# Patient Record
Sex: Female | Born: 1990 | Race: White | Hispanic: No | Marital: Single | Smoking: Never smoker
Health system: Southern US, Community
[De-identification: ages and names within clinical notes are randomized; demographics above are authoritative.]

## PROBLEM LIST (undated history)

## (undated) DIAGNOSIS — G43909 Migraine, unspecified, not intractable, without status migrainosus: Secondary | ICD-10-CM

---

## 2013-10-25 ENCOUNTER — Emergency Department (HOSPITAL_COMMUNITY)
Admission: EM | Admit: 2013-10-25 | Discharge: 2013-10-25 | Disposition: A | Discharge status: Home / Self Care | Payer: No Typology Code available for payment source | Attending: Emergency Medicine | Admitting: Emergency Medicine

## 2013-10-25 ENCOUNTER — Encounter (HOSPITAL_COMMUNITY): Payer: Self-pay | Admitting: Emergency Medicine

## 2013-10-25 ENCOUNTER — Emergency Department (HOSPITAL_COMMUNITY): Payer: No Typology Code available for payment source

## 2013-10-25 DIAGNOSIS — Y9389 Activity, other specified: Secondary | ICD-10-CM | POA: Diagnosis not present

## 2013-10-25 DIAGNOSIS — Z8679 Personal history of other diseases of the circulatory system: Secondary | ICD-10-CM | POA: Diagnosis not present

## 2013-10-25 DIAGNOSIS — W1809XA Striking against other object with subsequent fall, initial encounter: Secondary | ICD-10-CM | POA: Insufficient documentation

## 2013-10-25 DIAGNOSIS — Y929 Unspecified place or not applicable: Secondary | ICD-10-CM | POA: Insufficient documentation

## 2013-10-25 DIAGNOSIS — Z79899 Other long term (current) drug therapy: Secondary | ICD-10-CM | POA: Diagnosis not present

## 2013-10-25 DIAGNOSIS — S0990XA Unspecified injury of head, initial encounter: Secondary | ICD-10-CM | POA: Diagnosis not present

## 2013-10-25 DIAGNOSIS — W19XXXA Unspecified fall, initial encounter: Secondary | ICD-10-CM

## 2013-10-25 HISTORY — DX: Migraine, unspecified, not intractable, without status migrainosus: G43.909

## 2013-10-25 NOTE — Discharge Instructions (Signed)
Return to the ED with any concerns including vomiting and not able to keep down liquids, seizure activity, changes in vision or speech, decreased level of alertness/lethargy, or any other alarming symptoms

## 2013-10-25 NOTE — ED Provider Notes (Signed)
CSN: [id number]     Arrival date & time 10/25/13  1917 History   First MD Initiated Contact with Patient 10/25/13 2037     Chief Complaint  Patient presents with  . Fall     (Consider location/radiation/quality/duration/timing/severity/associated sxs/prior Treatment) HPI Pt presents with c/o fall and hitting her head.  She states that she had been drinking alcohol last night and remembers hitting her head, but not the fall.  Friend was with her and states she did not lose consciousness, but did hit the back of her head twice and seemed dazed afterward.  No vomiting.  No LOC.  No neck or back  Pain.  She slept for several hours and awoke normally.  Throughout the day today she has been having headache.  Took ibuprofen which did not help very much.  No confusion, no changes in vision or speech.  There are no other associated systemic symptoms, there are no other alleviating or modifying factors.   Past Medical History  Diagnosis Date  . Migraines    History reviewed. No pertinent past surgical history. History reviewed. No pertinent family history. History  Substance Use Topics  . Smoking status: Never Smoker   . Smokeless tobacco: Not on file  . Alcohol Use: Yes   OB History   Grav Para Term Preterm Abortions TAB SAB Ect Mult Living                 Review of Systems ROS reviewed and all otherwise negative except for mentioned in HPI    Allergies  Review of patient's allergies indicates no known allergies.  Home Medications   Prior to Admission medications   Medication Sig Start Date End Date Taking? Authorizing Provider  Multiple Vitamin (MULTIVITAMIN WITH MINERALS) TABS tablet Take 1 tablet by mouth daily.   Yes Historical Provider, MD  NORETHINDRONE ACET-ETHINYL EST PO Take by mouth.   Yes Historical Provider, MD  valACYclovir (VALTREX) 500 MG tablet Take 500 mg by mouth daily.   Yes Historical Provider, MD   BP 122/69  Pulse 84  Temp(Src) 97.9 F (36.6 C) (Oral)   Resp 18  SpO2 99%  LMP 10/14/2013 Vitals reviewed Physical Exam Physical Examination: General appearance - alert, well appearing, and in no distress Mental status - alert, oriented to person, place, and time Head- NCAT except tiny superficial abrasion to occiput Eyes - pupils equal and reactive, extraocular eye movements intact Mouth - mucous membranes moist, pharynx normal without lesions Neck - no midline tenderness to palpation, FROM without pain Chest - clear to auscultation, no wheezes, rales or rhonchi, symmetric air entry Heart - normal rate, regular rhythm, normal S1, S2, no murmurs, rubs, clicks or gallops Back exam - full range of motion, no midline tenderness Neurological - alert, oriented, normal speech, no focal findings or movement disorder noted Musculoskeletal - no joint tenderness, deformity or swelling Extremities - peripheral pulses normal, no pedal edema, no clubbing or cyanosis Skin - normal coloration and turgor, no rashes  ED Course  Procedures (including critical care time) Labs Review Labs Reviewed - No data to display  Imaging Review No results found.   EKG Interpretation None      MDM   Final diagnoses:  Fall, initial encounter  Minor head injury, initial encounter   Pt presenting with c/o fall and striking her head last night while intoxicated.  Headache persists today.  Head CT reassuring.  No neck or back tenderness.  Discharged with strict return precautions.  Pt  agreeable with plan.  Nursing notes including past medical history and social history reviewed and considered in documentation Prior records reviewed and considered during this visit     Ethelda ChickMartha K Linker, MD 10/28/13 [phone]

## 2013-10-25 NOTE — ED Notes (Signed)
Pt arrived to the Ed with a complaint of a fall and hit her head.  The pt's friend describes two falls with head hits. Pt states the back of her head hurts and she is sore.  Pt admits to excessive alcohol use last night.  Pupils are PERRLA.  Pt is able to answer questions appropriately

## 2014-01-19 ENCOUNTER — Ambulatory Visit (INDEPENDENT_AMBULATORY_CARE_PROVIDER_SITE_OTHER): Payer: PRIVATE HEALTH INSURANCE | Admitting: Emergency Medicine

## 2014-01-19 VITALS — BP 124/74 | HR 80 | Temp 97.8°F | Resp 16 | Ht 64.75 in | Wt 131.4 lb

## 2014-01-19 DIAGNOSIS — Z3049 Encounter for surveillance of other contraceptives: Secondary | ICD-10-CM

## 2014-01-19 DIAGNOSIS — Z124 Encounter for screening for malignant neoplasm of cervix: Secondary | ICD-10-CM

## 2014-01-19 LAB — POCT WET PREP WITH KOH
Clue Cells Wet Prep HPF POC: NEGATIVE
KOH Prep POC: NEGATIVE
Trichomonas, UA: NEGATIVE
Yeast Wet Prep HPF POC: NEGATIVE

## 2014-01-19 MED ORDER — NORETHIN ACE-ETH ESTRAD-FE 1.5-30 MG-MCG PO TABS: 1.0000 | ORAL_TABLET | Freq: Every day | ORAL | Status: AC

## 2014-01-19 MED ORDER — VALACYCLOVIR HCL 500 MG PO TABS: 500.0000 mg | ORAL_TABLET | Freq: Every day | ORAL | Status: AC

## 2014-01-19 NOTE — Patient Instructions (Signed)
Pap Test A Pap test is a procedure done in a clinic office to evaluate cells that are on the surface of the cervix. The cervix is the lower portion of the uterus and upper portion of the vagina. For some women, the cervical region has the potential to form cancer. With consistent evaluations by your caregiver, this type of cancer can be prevented.  If a Pap test is abnormal, it is most often a result of a previous exposure to human papillomavirus (HPV). HPV is a virus that can infect the cells of the cervix and cause dysplasia. Dysplasia is where the cells no longer look normal. If a woman has been diagnosed with high-grade or severe dysplasia, they are at higher risk of developing cervical cancer. People diagnosed with low-grade dysplasia should still be seen by their caregiver because there is a small chance that low-grade dysplasia could develop into cancer.  LET YOUR CAREGIVER KNOW ABOUT:  Recent sexually transmitted infection (STI) you have had.  Any new sex partners you have had.  History of previous abnormal Pap tests results.  History of previous cervical procedures you have had (colposcopy, biopsy, loop electrosurgical excision procedure [LEEP]).  Concerns you have had regarding unusual vaginal discharge.  History of pelvic pain.  Your use of birth control. BEFORE THE PROCEDURE  Ask your caregiver when to schedule your Pap test. It is best not to be on your period if your caregiver uses a wooden spatula to collect cells or applies cells to a glass slide. Newer techniques are not so sensitive to the timing of a menstrual cycle.  Do not douche or have sexual intercourse for 24 hours before the test.   Do not use vaginal creams or tampons for 24 hours before the test.   Empty your bladder just before the test to lessen any discomfort.  PROCEDURE You will lie on an exam table with your feet in stirrups. A warm metal or plastic instrument (speculum) is placed in your vagina. This  instrument allows your caregiver to see the inside of your vagina and look at your cervix. A small, plastic brush or wooden spatula is then used to collect cervical cells. These cells are placed in a lab specimen container. The cells are looked at under a microscope. A specialist will determine if the cells are normal.  AFTER THE PROCEDURE Make sure to get your test results.If your results come back abnormal, you may need further testing.  Document Released: 06/30/2002 Document Revised: 07/02/2011 Document Reviewed: 04/05/2011 [employer] Patient Information 2015 [employer]. This information is not intended to replace advice given to you by your health care provider. Make sure you discuss any questions you have with your health care provider.  

## 2014-01-19 NOTE — Addendum Note (Signed)
Addended by: Carmelina DaneANDERSON, Siah Kannan S on: 01/19/2014 04:35 PM   Modules accepted: Orders

## 2014-01-19 NOTE — Addendum Note (Signed)
Addended by: Johnnette LitterARDWELL, Ayad Nieman M on: 01/19/2014 04:46 PM   Modules accepted: Orders

## 2014-01-19 NOTE — Progress Notes (Signed)
[facility] [address], [place] [id number] [phone]  Date:  01/19/2014   Name:  Jasmine Meza   DOB:  1990/09/03   MRN:  [id number]  PCP:  No PCP Per Patient    Chief Complaint: Medication Refill and Gynecologic Exam   History of Present Illness:  Jasmine IshikawaMorgan Givans is a 23 y.o. very pleasant female patient who presents with the following:  Requests a pap and refill on OCP  No current health complaints.  Has never had a PAP No improvement with over the counter medications or other home remedies. Denies other complaint or health concern today.   There are no active problems to display for this patient.   Past Medical History  Diagnosis Date  . Migraines     History reviewed. No pertinent past surgical history.  History  Substance Use Topics  . Smoking status: Never Smoker   . Smokeless tobacco: Not on file  . Alcohol Use: No    Family History  Problem Relation Age of Onset  . Stroke Maternal Grandmother   . Stroke Maternal Grandfather   . Heart disease Maternal Grandfather   . Cancer Paternal Grandfather     No Known Allergies  Medication list has been reviewed and updated.  Current Outpatient Prescriptions on File Prior to Visit  Medication Sig Dispense Refill  . Multiple Vitamin (MULTIVITAMIN WITH MINERALS) TABS tablet Take 1 tablet by mouth daily.      . NORETHINDRONE ACET-ETHINYL EST PO Take by mouth.      . valACYclovir (VALTREX) 500 MG tablet Take 500 mg by mouth daily.       No current facility-administered medications on file prior to visit.    Review of Systems:  As per HPI, otherwise negative.    Physical Examination: Filed Vitals:   01/19/14 1607  BP: 124/74  Pulse: 80  Temp: 97.8 F (36.6 C)  Resp: 16   Filed Vitals:   01/19/14 1607  Height: 5' 4.75" (1.645 m)  Weight: 131 lb 6.4 oz (59.603 kg)   Body mass index is 22.03 kg/(m^2). Ideal Body Weight: Weight in (lb) to have BMI = 25: 148.8  GEN: WDWN, NAD,  Non-toxic, A & O x 3 HEENT: Atraumatic, Normocephalic. Neck supple. No masses, No LAD. Ears and Nose: No external deformity. CV: RRR, No M/G/R. No JVD. No thrill. No extra heart sounds. PULM: CTA B, no wheezes, crackles, rhonchi. No retractions. No resp. distress. No accessory muscle use. ABD: S, NT, ND, +BS. No rebound. No HSM. EXTR: No c/c/e NEURO Normal gait.  PSYCH: Normally interactive. Conversant. Not depressed or anxious appearing.  Calm demeanor.  Physical Examination: Pelvic - normal external genitalia, vulva, vagina, cervix, uterus and adnexa, exam chaperoned by CHAN     Assessment and Plan: OCP refill Pap  Signed,  Phillips OdorJeffery Dishawn Bhargava, MD

## 2014-01-20 LAB — PAP IG, CT-NG, RFX HPV ASCU
CHLAMYDIA PROBE AMP: NEGATIVE
GC Probe Amp: NEGATIVE

## 2015-02-11 IMAGING — CT CT HEAD W/O CM
2 series · 17 of 30 positions shown, 20 images · non-contrast
Comparison: None.

CLINICAL DATA: Posterior headache after falling and hitting her
head twice.

EXAM:
CT HEAD WITHOUT CONTRAST
TECHNIQUE: Contiguous axial images were obtained from the base of the skull
through the vertex without intravenous contrast.

[Series 2: head w/o · axial · non-contrast · 0.39mm/px · z∈[-9,+101]mm · 9 of 28 slices shown, 12 images]
[im 3/28  brain]
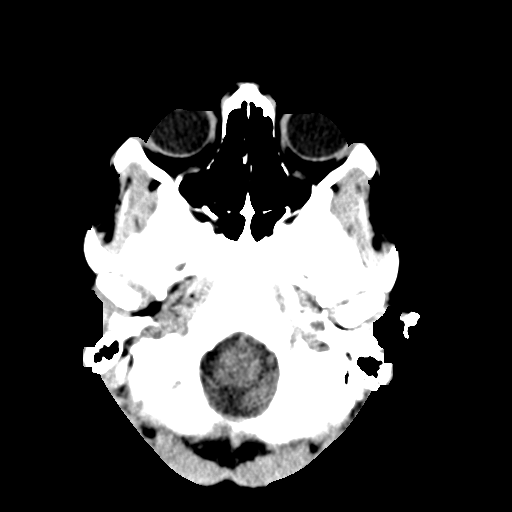
[im 3/28  bone]
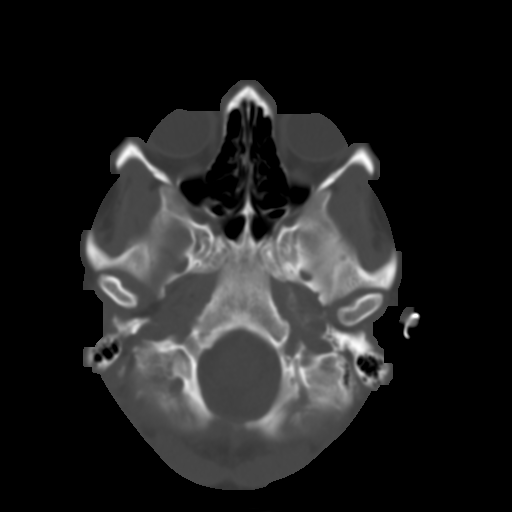
[im 6/28  brain]
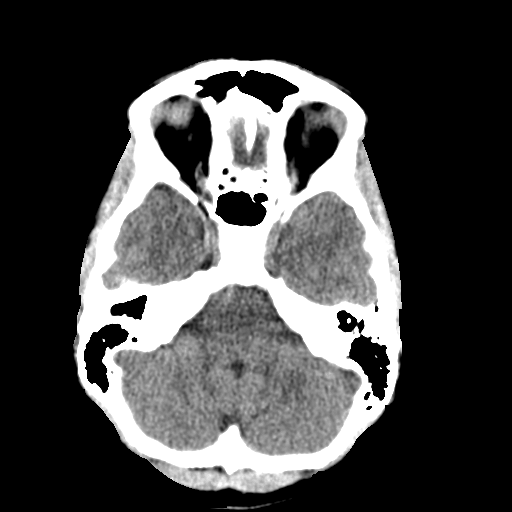
[im 9/28  brain]
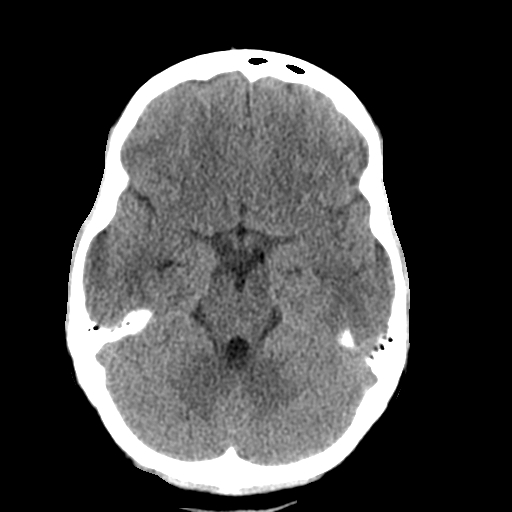
[im 11/28  brain]
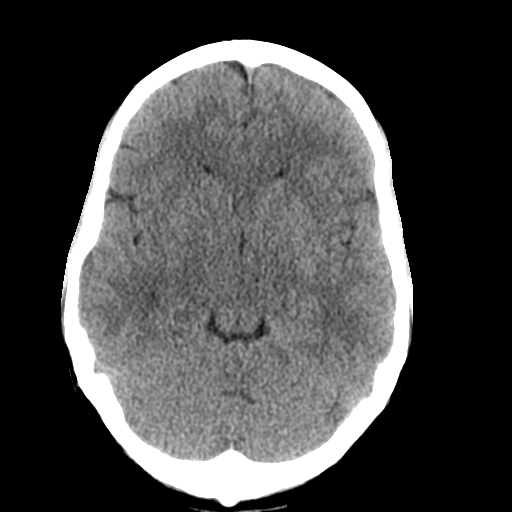
[im 14/28  brain]
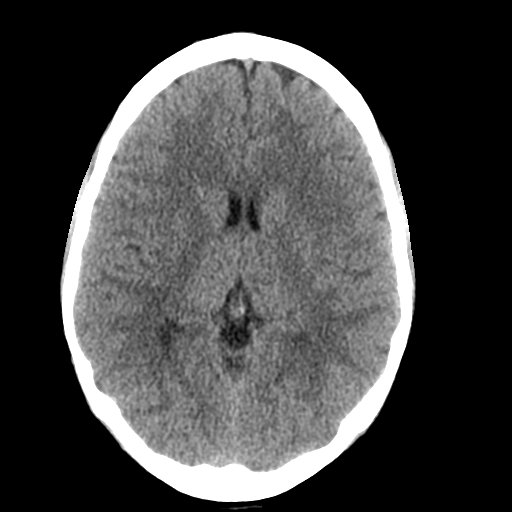
[im 14/28  bone]
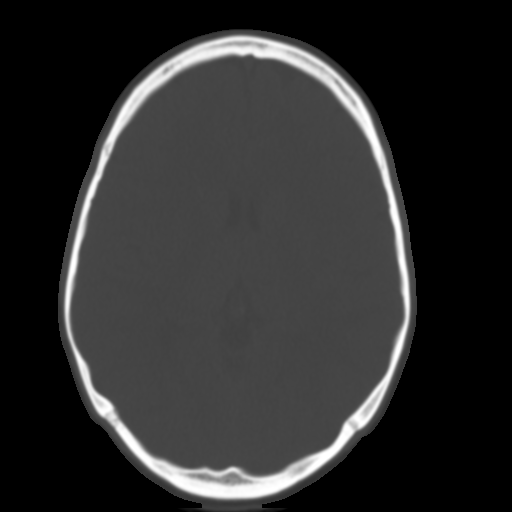
[im 17/28  brain]
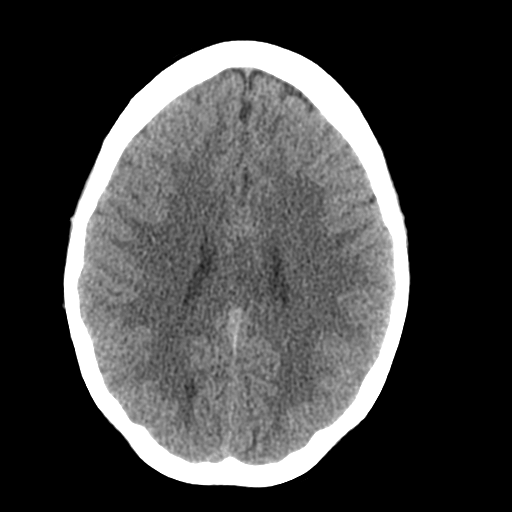
[im 19/28  brain]
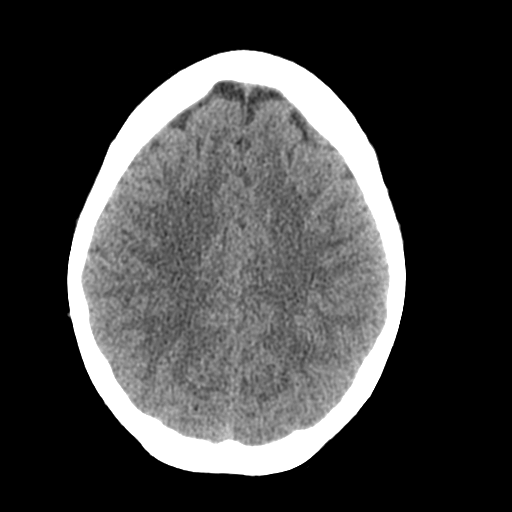
[im 22/28  brain]
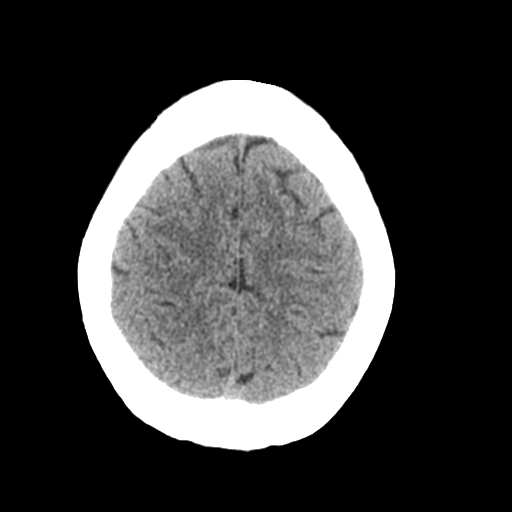
[im 25/28  brain]
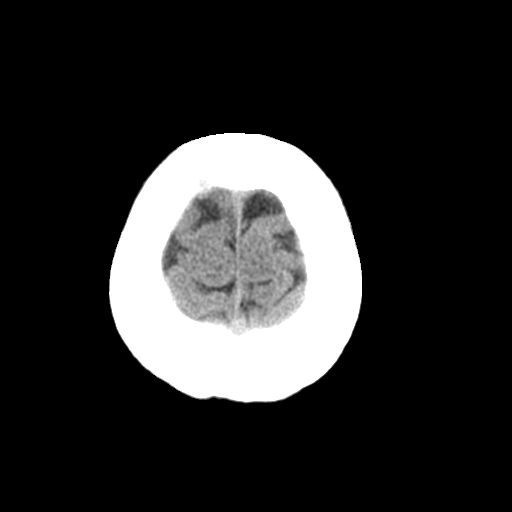
[im 25/28  bone]
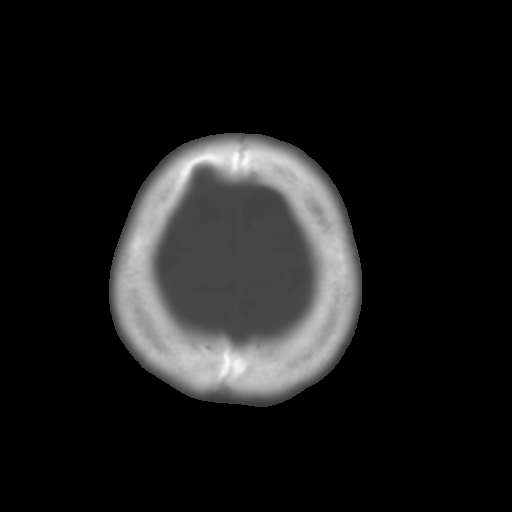

[Series 3: bone windows · axial · 0.39mm/px · z∈[-4,+101]mm · 8 of 46 slices shown]
[im 6/46  bone]
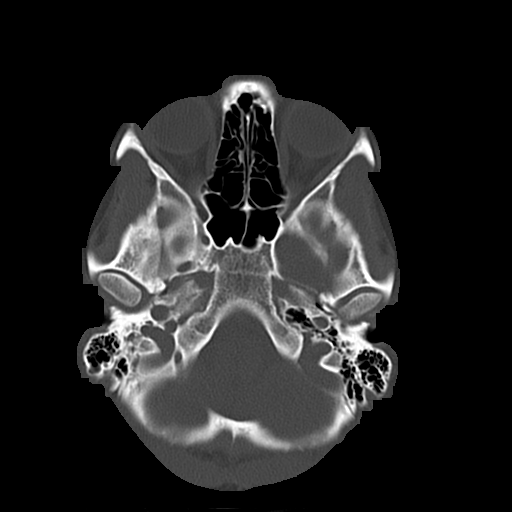
[im 11/46  bone]
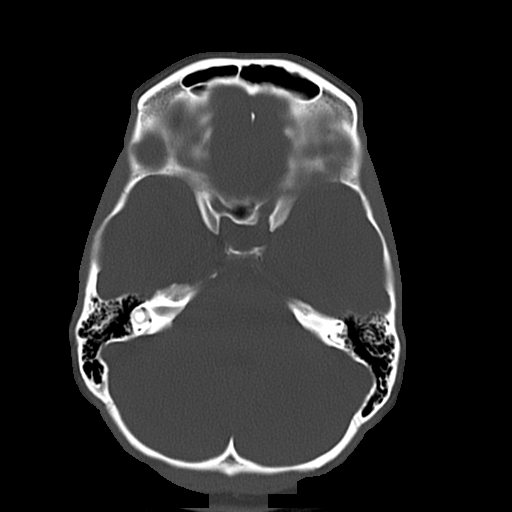
[im 16/46  bone]
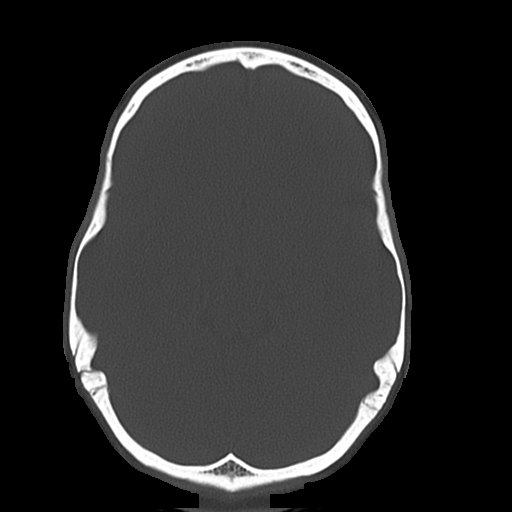
[im 21/46  bone]
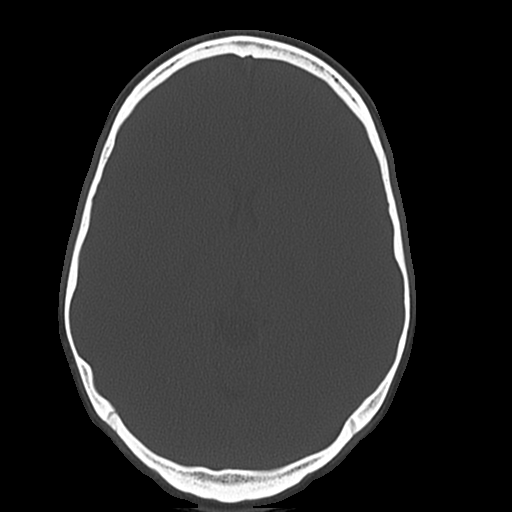
[im 26/46  bone]
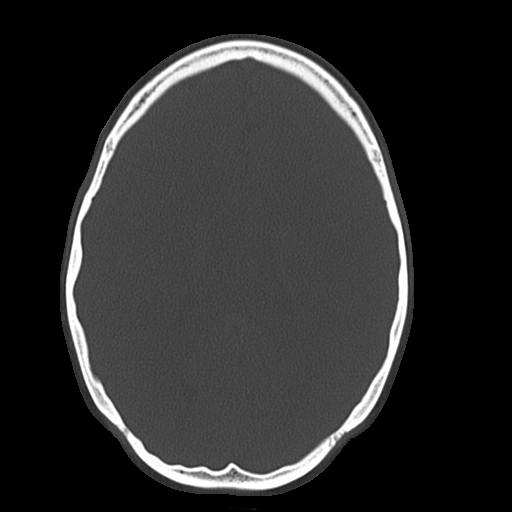
[im 31/46  bone]
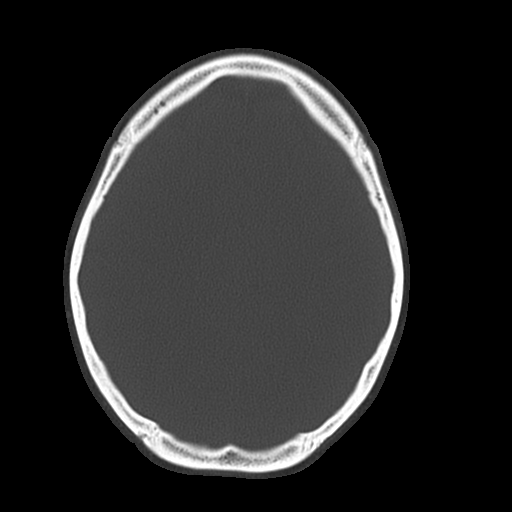
[im 36/46  bone]
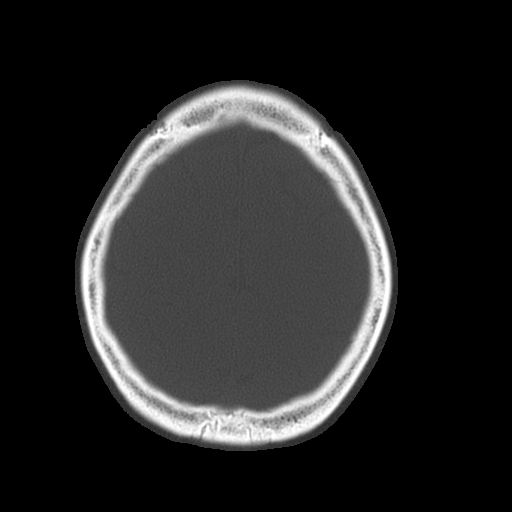
[im 41/46  bone]
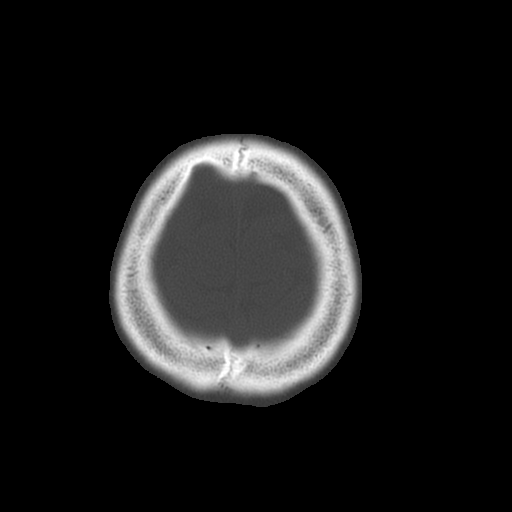

[17 of 30 positions shown; findings below may reference images not displayed]

FINDINGS: Normal appearing cerebral hemispheres and posterior fossa
structures. Normal size and position of the ventricles. No skull
fracture, intracranial hemorrhage or paranasal sinus air-fluid
levels.
IMPRESSION: Normal examination.
